# Patient Record
Sex: Male | Born: 1980 | Race: White | Hispanic: No | Marital: Single | Smoking: Current every day smoker
Health system: Southern US, Community
[De-identification: ages and names within clinical notes are randomized; demographics above are authoritative.]

## PROBLEM LIST (undated history)

## (undated) ENCOUNTER — Emergency Department (HOSPITAL_COMMUNITY): Payer: Self-pay | Source: Home / Self Care

---
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
## 2004-12-27 ENCOUNTER — Encounter: Payer: Self-pay | Admitting: Emergency Medicine

## 2004-12-28 ENCOUNTER — Inpatient Hospital Stay (HOSPITAL_COMMUNITY): Admission: AD | Admit: 2004-12-28 | Discharge: 2004-12-30 | Payer: Self-pay | Admitting: Internal Medicine

## 2004-12-30 ENCOUNTER — Inpatient Hospital Stay (HOSPITAL_COMMUNITY): Admission: RE | Admit: 2004-12-30 | Discharge: 2005-01-04 | Payer: Self-pay | Admitting: Psychiatry

## 2004-12-31 ENCOUNTER — Ambulatory Visit: Payer: Self-pay | Admitting: Psychiatry

## 2016-09-11 ENCOUNTER — Encounter (HOSPITAL_COMMUNITY): Payer: Self-pay

## 2016-09-11 ENCOUNTER — Emergency Department (HOSPITAL_COMMUNITY)
Admission: EM | Admit: 2016-09-11 | Discharge: 2016-09-12 | Disposition: A | Discharge status: Home / Self Care | Payer: Self-pay | Attending: Emergency Medicine | Admitting: Emergency Medicine

## 2016-09-11 DIAGNOSIS — Z23 Encounter for immunization: Secondary | ICD-10-CM | POA: Insufficient documentation

## 2016-09-11 DIAGNOSIS — R202 Paresthesia of skin: Secondary | ICD-10-CM | POA: Insufficient documentation

## 2016-09-11 DIAGNOSIS — M542 Cervicalgia: Secondary | ICD-10-CM | POA: Insufficient documentation

## 2016-09-11 DIAGNOSIS — R0781 Pleurodynia: Secondary | ICD-10-CM | POA: Insufficient documentation

## 2016-09-11 DIAGNOSIS — Y939 Activity, unspecified: Secondary | ICD-10-CM | POA: Insufficient documentation

## 2016-09-11 DIAGNOSIS — Y92410 Unspecified street and highway as the place of occurrence of the external cause: Secondary | ICD-10-CM | POA: Insufficient documentation

## 2016-09-11 DIAGNOSIS — Y998 Other external cause status: Secondary | ICD-10-CM | POA: Insufficient documentation

## 2016-09-11 DIAGNOSIS — M545 Low back pain: Secondary | ICD-10-CM | POA: Insufficient documentation

## 2016-09-11 MED ORDER — TETANUS-DIPHTH-ACELL PERTUSSIS 5-2.5-18.5 LF-MCG/0.5 IM SUSP
0.5000 mL | Freq: Once | INTRAMUSCULAR | Status: AC
  Administered 2016-09-12: 0.5 mL via INTRAMUSCULAR
  Filled 2016-09-11: qty 0.5
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
## 2016-09-11 NOTE — ED Notes (Signed)
Pt was ambulated to the bathroom and urine sample collected.
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
## 2016-09-12 ENCOUNTER — Encounter (HOSPITAL_COMMUNITY): Payer: Self-pay

## 2016-09-12 ENCOUNTER — Emergency Department (HOSPITAL_COMMUNITY): Payer: Self-pay

## 2016-09-12 LAB — CBC WITH DIFFERENTIAL/PLATELET
BASOS PCT: 0 %
Basophils Absolute: 0 10*3/uL (ref 0.0–0.1)
EOS ABS: 0.2 10*3/uL (ref 0.0–0.7)
EOS PCT: 3 %
HCT: 44.6 % (ref 39.0–52.0)
Hemoglobin: 14.3 g/dL (ref 13.0–17.0)
LYMPHS ABS: 1.8 10*3/uL (ref 0.7–4.0)
Lymphocytes Relative: 25 %
MCH: 29.6 pg (ref 26.0–34.0)
MCHC: 32.1 g/dL (ref 30.0–36.0)
MCV: 92.3 fL (ref 78.0–100.0)
Monocytes Absolute: 0.5 10*3/uL (ref 0.1–1.0)
Monocytes Relative: 7 %
Neutro Abs: 4.7 10*3/uL (ref 1.7–7.7)
Neutrophils Relative %: 65 %
PLATELETS: 222 10*3/uL (ref 150–400)
RBC: 4.83 MIL/uL (ref 4.22–5.81)
RDW: 13.7 % (ref 11.5–15.5)
WBC: 7.2 10*3/uL (ref 4.0–10.5)

## 2016-09-12 LAB — I-STAT CHEM 8, ED
BUN: 9 mg/dL (ref 6–20)
CALCIUM ION: 1.03 mmol/L — AB (ref 1.15–1.40)
Chloride: 100 mmol/L — ABNORMAL LOW (ref 101–111)
Creatinine, Ser: 1.2 mg/dL (ref 0.61–1.24)
Glucose, Bld: 102 mg/dL — ABNORMAL HIGH (ref 65–99)
HEMATOCRIT: 43 % (ref 39.0–52.0)
Hemoglobin: 14.6 g/dL (ref 13.0–17.0)
Potassium: 4.1 mmol/L (ref 3.5–5.1)
SODIUM: 140 mmol/L (ref 135–145)
TCO2: 30 mmol/L (ref 0–100)

## 2016-09-12 LAB — RAPID URINE DRUG SCREEN, HOSP PERFORMED
Amphetamines: NOT DETECTED
BENZODIAZEPINES: NOT DETECTED
Barbiturates: NOT DETECTED
COCAINE: POSITIVE — AB
OPIATES: POSITIVE — AB
Tetrahydrocannabinol: NOT DETECTED

## 2016-09-12 MED ORDER — SODIUM CHLORIDE 0.9 % IV BOLUS (SEPSIS)
1000.0000 mL | Freq: Once | INTRAVENOUS | Status: AC
  Administered 2016-09-12: 1000 mL via INTRAVENOUS

## 2016-09-12 MED ORDER — DEXTROSE 5 % IV SOLN
1000.0000 mg | Freq: Once | INTRAVENOUS | Status: AC
  Administered 2016-09-12: 1000 mg via INTRAVENOUS
  Filled 2016-09-12: qty 10

## 2016-09-12 MED ORDER — KETOROLAC TROMETHAMINE 30 MG/ML IJ SOLN
15.0000 mg | Freq: Once | INTRAMUSCULAR | Status: AC
  Administered 2016-09-12: 15 mg via INTRAVENOUS
  Filled 2016-09-12: qty 1

## 2016-09-12 MED ORDER — MELOXICAM 7.5 MG PO TABS: 7.5000 mg | ORAL_TABLET | Freq: Every day | ORAL | 0 refills | Status: AC

## 2016-09-12 MED ORDER — METHOCARBAMOL 500 MG PO TABS: 500.0000 mg | ORAL_TABLET | Freq: Two times a day (BID) | ORAL | 0 refills | Status: AC

## 2016-09-12 MED ORDER — IOPAMIDOL (ISOVUE-300) INJECTION 61%: 100.0000 mL | Freq: Once | INTRAVENOUS | Status: DC | PRN

## 2016-09-12 MED ORDER — METHOCARBAMOL 1000 MG/10ML IJ SOLN: 1000.0000 mg | Freq: Once | INTRAMUSCULAR | Status: DC

## 2016-09-12 NOTE — ED Notes (Signed)
Pt appears to be intoxicated. Pt drifting in and out of sleep.

## 2016-10-13 ENCOUNTER — Emergency Department (HOSPITAL_COMMUNITY): Payer: Self-pay

## 2016-10-13 ENCOUNTER — Encounter (HOSPITAL_COMMUNITY): Payer: Self-pay | Admitting: *Deleted

## 2016-10-13 ENCOUNTER — Emergency Department (HOSPITAL_COMMUNITY)
Admission: EM | Admit: 2016-10-13 | Discharge: 2016-10-13 | Disposition: A | Discharge status: Home / Self Care | Payer: Self-pay | Attending: Emergency Medicine | Admitting: Emergency Medicine

## 2016-10-13 DIAGNOSIS — F17210 Nicotine dependence, cigarettes, uncomplicated: Secondary | ICD-10-CM | POA: Insufficient documentation

## 2016-10-13 DIAGNOSIS — Y939 Activity, unspecified: Secondary | ICD-10-CM | POA: Insufficient documentation

## 2016-10-13 DIAGNOSIS — W3400XA Accidental discharge from unspecified firearms or gun, initial encounter: Secondary | ICD-10-CM | POA: Insufficient documentation

## 2016-10-13 DIAGNOSIS — Y998 Other external cause status: Secondary | ICD-10-CM | POA: Insufficient documentation

## 2016-10-13 DIAGNOSIS — Y929 Unspecified place or not applicable: Secondary | ICD-10-CM | POA: Insufficient documentation

## 2016-10-13 DIAGNOSIS — S71102A Unspecified open wound, left thigh, initial encounter: Secondary | ICD-10-CM | POA: Insufficient documentation

## 2016-10-13 LAB — BPAM RBC
Blood Product Expiration Date: 201807022359
Blood Product Expiration Date: 201807032359
ISSUE DATE / TIME: 201806271528
ISSUE DATE / TIME: 201806271528
UNIT TYPE AND RH: 9500
Unit Type and Rh: 9500

## 2016-10-13 LAB — ETHANOL: ALCOHOL ETHYL (B): 16 mg/dL — AB (ref <5)

## 2016-10-13 LAB — TYPE AND SCREEN
ABO/RH(D): A POS
Antibody Screen: NEGATIVE
UNIT DIVISION: 0
Unit division: 0

## 2016-10-13 LAB — I-STAT CG4 LACTIC ACID, ED: LACTIC ACID, VENOUS: 2.16 mmol/L — AB (ref 0.5–1.9)

## 2016-10-13 LAB — I-STAT CHEM 8, ED
BUN: 16 mg/dL (ref 6–20)
CHLORIDE: 107 mmol/L (ref 101–111)
Calcium, Ion: 1.12 mmol/L — ABNORMAL LOW (ref 1.15–1.40)
Creatinine, Ser: 0.8 mg/dL (ref 0.61–1.24)
Glucose, Bld: 138 mg/dL — ABNORMAL HIGH (ref 65–99)
HEMATOCRIT: 38 % — AB (ref 39.0–52.0)
Hemoglobin: 12.9 g/dL — ABNORMAL LOW (ref 13.0–17.0)
Potassium: 3.5 mmol/L (ref 3.5–5.1)
SODIUM: 143 mmol/L (ref 135–145)
TCO2: 24 mmol/L (ref 0–100)

## 2016-10-13 LAB — PREPARE FRESH FROZEN PLASMA
UNIT DIVISION: 0
Unit division: 0

## 2016-10-13 LAB — PROTIME-INR
INR: 1.04
Prothrombin Time: 13.6 seconds (ref 11.4–15.2)

## 2016-10-13 LAB — CBC
HCT: 38.4 % — ABNORMAL LOW (ref 39.0–52.0)
Hemoglobin: 12 g/dL — ABNORMAL LOW (ref 13.0–17.0)
MCH: 28.3 pg (ref 26.0–34.0)
MCHC: 31.3 g/dL (ref 30.0–36.0)
MCV: 90.6 fL (ref 78.0–100.0)
Platelets: 161 10*3/uL (ref 150–400)
RBC: 4.24 MIL/uL (ref 4.22–5.81)
RDW: 13.5 % (ref 11.5–15.5)
WBC: 4.9 10*3/uL (ref 4.0–10.5)

## 2016-10-13 LAB — COMPREHENSIVE METABOLIC PANEL
ALBUMIN: 2.9 g/dL — AB (ref 3.5–5.0)
ALK PHOS: 62 U/L (ref 38–126)
ALT: 32 U/L (ref 17–63)
AST: 31 U/L (ref 15–41)
Anion gap: 6 (ref 5–15)
BILIRUBIN TOTAL: 0.3 mg/dL (ref 0.3–1.2)
BUN: 12 mg/dL (ref 6–20)
CALCIUM: 8.4 mg/dL — AB (ref 8.9–10.3)
CO2: 24 mmol/L (ref 22–32)
Chloride: 109 mmol/L (ref 101–111)
Creatinine, Ser: 0.91 mg/dL (ref 0.61–1.24)
GFR calc Af Amer: >60 mL/min (ref >60)
GFR calc non Af Amer: >60 mL/min (ref >60)
GLUCOSE: 143 mg/dL — AB (ref 65–99)
Potassium: 3.4 mmol/L — ABNORMAL LOW (ref 3.5–5.1)
Sodium: 139 mmol/L (ref 135–145)
TOTAL PROTEIN: 5.9 g/dL — AB (ref 6.5–8.1)

## 2016-10-13 LAB — BPAM FFP
Blood Product Expiration Date: 201806272359
Blood Product Expiration Date: 201807012359
ISSUE DATE / TIME: 201806271528
ISSUE DATE / TIME: 201806271528
Unit Type and Rh: 6200
Unit Type and Rh: 6200

## 2016-10-13 LAB — ABO/RH: ABO/RH(D): A POS

## 2016-10-13 MED ORDER — IOPAMIDOL (ISOVUE-370) INJECTION 76%
INTRAVENOUS | Status: AC
  Filled 2016-10-13: qty 100

## 2016-10-13 MED ORDER — FENTANYL CITRATE (PF) 100 MCG/2ML IJ SOLN
INTRAMUSCULAR | Status: AC | PRN
  Administered 2016-10-13: 100 ug via INTRAVENOUS

## 2016-10-13 MED ORDER — TETANUS-DIPHTH-ACELL PERTUSSIS 5-2.5-18.5 LF-MCG/0.5 IM SUSP
0.5000 mL | Freq: Once | INTRAMUSCULAR | Status: AC
  Administered 2016-10-13: 0.5 mL via INTRAMUSCULAR

## 2016-10-13 MED ORDER — IOPAMIDOL (ISOVUE-370) INJECTION 76%
INTRAVENOUS | Status: AC
  Administered 2016-10-13: 100 mL
  Filled 2016-10-13: qty 100
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
## 2016-10-13 NOTE — ED Notes (Signed)
Pt's wound was irrigated with saline. Bacitracin was added to the wound. Non-adherent (Telfa) was applied, 4X4 covered the non-adherent, and the pt's thigh was wrapped with stretch gauze, per Toniann FailWendy - RN.

## 2016-10-14 ENCOUNTER — Encounter (HOSPITAL_COMMUNITY): Payer: Self-pay

## 2017-07-18 DEATH — deceased
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
## 2018-07-19 DEATH — deceased
